# Patient Record
Sex: Female | Born: 1965 | Race: White | Hispanic: No | Smoking: Never smoker
Health system: Southern US, Community
[De-identification: ages and names within clinical notes are randomized; demographics above are authoritative.]

## PROBLEM LIST (undated history)

## (undated) DIAGNOSIS — G629 Polyneuropathy, unspecified: Secondary | ICD-10-CM

## (undated) DIAGNOSIS — M4310 Spondylolisthesis, site unspecified: Secondary | ICD-10-CM

## (undated) HISTORY — PX: BREAST SURGERY: SHX581

## (undated) HISTORY — PX: GASTRECTOMY: SHX58

## (undated) HISTORY — PX: DILATION AND CURETTAGE OF UTERUS: SHX78

## (undated) HISTORY — PX: TONSILLECTOMY: SUR1361

## (undated) HISTORY — PX: ABDOMINAL SURGERY: SHX537

---

## 2018-01-27 ENCOUNTER — Encounter (HOSPITAL_BASED_OUTPATIENT_CLINIC_OR_DEPARTMENT_OTHER): Payer: Self-pay | Admitting: *Deleted

## 2018-01-27 ENCOUNTER — Other Ambulatory Visit: Payer: Self-pay

## 2018-01-27 ENCOUNTER — Emergency Department (HOSPITAL_BASED_OUTPATIENT_CLINIC_OR_DEPARTMENT_OTHER): Payer: 59

## 2018-01-27 ENCOUNTER — Emergency Department (HOSPITAL_BASED_OUTPATIENT_CLINIC_OR_DEPARTMENT_OTHER)
Admission: EM | Admit: 2018-01-27 | Discharge: 2018-01-27 | Disposition: A | Discharge status: Home / Self Care | Payer: 59 | Attending: Emergency Medicine | Admitting: Emergency Medicine

## 2018-01-27 DIAGNOSIS — Y999 Unspecified external cause status: Secondary | ICD-10-CM | POA: Diagnosis not present

## 2018-01-27 DIAGNOSIS — Z79899 Other long term (current) drug therapy: Secondary | ICD-10-CM | POA: Diagnosis not present

## 2018-01-27 DIAGNOSIS — S40021A Contusion of right upper arm, initial encounter: Secondary | ICD-10-CM | POA: Diagnosis not present

## 2018-01-27 DIAGNOSIS — Y9301 Activity, walking, marching and hiking: Secondary | ICD-10-CM | POA: Insufficient documentation

## 2018-01-27 DIAGNOSIS — Y929 Unspecified place or not applicable: Secondary | ICD-10-CM | POA: Insufficient documentation

## 2018-01-27 DIAGNOSIS — S53401A Unspecified sprain of right elbow, initial encounter: Secondary | ICD-10-CM | POA: Diagnosis not present

## 2018-01-27 DIAGNOSIS — S59911A Unspecified injury of right forearm, initial encounter: Secondary | ICD-10-CM | POA: Diagnosis present

## 2018-01-27 DIAGNOSIS — W0110XA Fall on same level from slipping, tripping and stumbling with subsequent striking against unspecified object, initial encounter: Secondary | ICD-10-CM | POA: Diagnosis not present

## 2018-01-27 DIAGNOSIS — W19XXXA Unspecified fall, initial encounter: Secondary | ICD-10-CM

## 2018-01-27 HISTORY — DX: Polyneuropathy, unspecified: G62.9

## 2018-01-27 HISTORY — DX: Spondylolisthesis, site unspecified: M43.10

## 2018-01-27 MED ORDER — HYDROCODONE-ACETAMINOPHEN 5-325 MG PO TABS
1.0000 | ORAL_TABLET | Freq: Once | ORAL | Status: AC
  Administered 2018-01-27: 1 via ORAL
  Filled 2018-01-27: qty 1

## 2018-01-27 NOTE — ED Notes (Signed)
Pt verbalizes understanding of d/c instructions and denies any further needs at this time. 

## 2018-01-27 NOTE — Discharge Instructions (Signed)
You can take Tylenol or Ibuprofen as directed for pain. You can alternate Tylenol and Ibuprofen every 4 hours. If you take Tylenol at 1pm, then you can take Ibuprofen at 5pm. Then you can take Tylenol again at 9pm.   Wear the sling for support and stabilization of the elbow.  Do not wear it all the time as it may cause frozen shoulder.  Follow the RICE (Rest, Ice, Compression, Elevation) protocol as directed.   Follow-up with referred orthopedic doctor if her symptoms do not improve within the next 4-7 days.  Return to the emergency department for any worsening pain, redness or swelling of the arm, numbness/weakness, discoloration of the fingers or any other worsening or concerning symptoms.

## 2018-01-27 NOTE — ED Triage Notes (Signed)
Pt fell on her right side, then twisted and fell on her face. Pt complains of right sided arm pain.

## 2018-01-27 NOTE — ED Notes (Signed)
Patient transported to X-ray 

## 2018-01-27 NOTE — ED Notes (Signed)
Pt c/o pain to right elbow and right forearm after tripping and falling.  Pt has swelling and bruising to outer aspect of right forearm and is unable to extend elbow completely without pain.

## 2018-01-27 NOTE — ED Provider Notes (Signed)
[facility] EMERGENCY DEPARTMENT Provider Note   CSN: [id number] Arrival date & time: 01/27/18  1707     History   Chief Complaint Chief Complaint  Patient presents with  . Fall    HPI Maria SquibbJennifer Ramirez is a 52 y.o. female past medical history of neuropathy who presents for evaluation of mechanical fall that occurred approximate 2 PM this afternoon.  Patient reports that she was walking up a curb when she misstepped, causing her to fall. Patient reports that her right arm took the brace of the fall.  Patient reports she did not hit her head or lose consciousness.  She is not on any blood thinners.  Patient reports that she was able to get up immediately after the incident.  Patient reports that since the incident, she has had pain to the right elbow, right forearm.  Pain is worsened with movement.  She took some ibuprofen at initial onset which she states has had mild improvement.  Patient denies any numbness/weakness, neck pain, back pain, chest pain, difficulty breathing, vision changes.  The history is provided by the patient.    Past Medical History:  Diagnosis Date  . Neuropathy   . Spondylolisthesis     There are no active problems to display for this patient.   Past Surgical History:  Procedure Laterality Date  . ABDOMINAL SURGERY    . BREAST SURGERY    . DILATION AND CURETTAGE OF UTERUS    . GASTRECTOMY    . TONSILLECTOMY      OB History    No data available       Home Medications    Prior to Admission medications   Medication Sig Start Date End Date Taking? Authorizing Provider  gabapentin (NEURONTIN) 600 MG tablet Take 600 mg by mouth 3 (three) times daily.   Yes [provider]    Family History History reviewed. No pertinent family history.  Social History Social History   Tobacco Use  . Smoking status: Never Smoker  . Smokeless tobacco: Never Used  Substance Use Topics  . Alcohol use: Yes    Alcohol/week: 3.6 oz    Types:  6 Shots of liquor per week  . Drug use: No     Allergies   Bactrim [sulfamethoxazole-trimethoprim] and Red dye   Review of Systems Review of Systems  Constitutional: Negative for fever.  Respiratory: Negative for shortness of breath.   Cardiovascular: Negative for chest pain.  Gastrointestinal: Negative for abdominal pain, nausea and vomiting.  Musculoskeletal: Negative for back pain and neck pain.       Right elbow pain Right arm pain  Neurological: Negative for weakness and numbness.     Physical Exam Updated Vital Signs BP (!) 150/83 (BP Location: Left Arm)   Pulse 80   Temp 98.6 F (37 C) (Oral)   Resp 18   Ht 5' 4.5" (1.638 m)   Wt (!) 142 kg (313 lb)   LMP 12/10/2017 (Exact Date) Comment: menopausal,  single period in last yr//a.c.  SpO2 99%   BMI 52.90 kg/m   Physical Exam  Constitutional: She is oriented to person, place, and time. She appears well-developed and well-nourished.  HENT:  Head: Normocephalic and atraumatic.  Mouth/Throat: Oropharynx is clear and moist and mucous membranes are normal.  No tenderness to palpation of skull. No deformities or crepitus noted. No open wounds, abrasions or lacerations.   Eyes: Conjunctivae, EOM and lids are normal. Pupils are equal, round, and reactive to light.  Neck: Full passive range of motion without pain.  Full flexion/extension and lateral movement of neck fully intact. No bony midline tenderness. No deformities or crepitus.   Cardiovascular: Normal rate, regular rhythm, normal heart sounds and normal pulses. Exam reveals no gallop and no friction rub.  No murmur heard. Pulmonary/Chest: Effort normal and breath sounds normal.  No evidence of respiratory distress. Able to speak in full sentences without difficulty.  Abdominal: Soft. Normal appearance. There is no tenderness. There is no rigidity and no guarding.  Musculoskeletal: Normal range of motion.  Tenderness palpation the lateral aspect of the right  elbow.  No deformity or crepitus noted.  Good flexion of left elbow without difficulty.  She does report some pain with extension of the elbow though was able to complete movement.  Diffuse tenderness palpation to the forearm with overlying soft tissue swelling and hematoma.  No tenderness palpation to the right wrist.  No snuffbox tenderness.  Full range of motion of left wrist intact without any difficulty.  No abnormalities of the left upper extremity.  Neurological: She is alert and oriented to person, place, and time.  Cranial nerves III-XII intact Follows commands, Moves all extremities  5/5 strength to BUE and BLE  Sensation intact throughout all major nerve distributions Normal finger to nose. No pronator drift. No gait abnormalities  No slurred speech. No facial droop.   Skin: Skin is warm and dry. Capillary refill takes less than 2 seconds.  Good distal cap refill. RLE is not dusky in appearance or cool to touch.  Psychiatric: She has a normal mood and affect. Her speech is normal.  Nursing note and vitals reviewed.    ED Treatments / Results  Labs (all labs ordered are listed, but only abnormal results are displayed) Labs Reviewed - No data to display  EKG  EKG Interpretation None       Radiology Dg Elbow Complete Right  Result Date: 01/27/2018 CLINICAL DATA:  Fall with pain at the olecranon process EXAM: RIGHT ELBOW - COMPLETE 3+ VIEW COMPARISON:  None. FINDINGS: There is no evidence of fracture, dislocation, or joint effusion. There is no evidence of arthropathy or other focal bone abnormality. Soft tissues are unremarkable. IMPRESSION: Negative. Electronically Signed   By: Jasmine Pang M.D.   On: 01/27/2018 22:12   Dg Forearm Right  Result Date: 01/27/2018 CLINICAL DATA:  Fall with pain EXAM: RIGHT FOREARM - 2 VIEW COMPARISON:  None. FINDINGS: There is no evidence of fracture or other focal bone lesions. Soft tissues swelling at the mid forearm. IMPRESSION: No acute  osseous abnormality. Electronically Signed   By: Jasmine Pang M.D.   On: 01/27/2018 22:12    Procedures Procedures (including critical care time)  Medications Ordered in ED Medications  HYDROcodone-acetaminophen (NORCO/VICODIN) 5-325 MG per tablet 1 tablet (1 tablet Oral Given 01/27/18 2304)     Initial Impression / Assessment and Plan / ED Course  I have reviewed the triage vital signs and the nursing notes.  Pertinent labs & imaging results that were available during my care of the patient were reviewed by me and considered in my medical decision making (see chart for details).     52 y.o. female who presents for evaluation of mechanical fall that occurred this afternoon.  No head injury, LOC, vomiting.  Patient is on blood thinners.  Presents the ED with complaints of right elbow, right forearm pain. Patient is afebrile, non-toxic appearing, sitting comfortably on examination table. Vital signs reviewed and stable.  Patient is neurovascularly intact.  On exam, patient has tenderness palpation to the lateral aspect of elbow.  No deformity or crepitus noted.  Patient initially reports some pain with extension.  Flexion intact without difficulty.  Throughout exam, patient begins fully extending and flexing her elbow without any difficulty.  Forearm with a small hematoma noted to the forearm.  No deformity or crepitus noted.  Consider sprain versus fracture versus dislocation.  X-rays ordered at triage.  X-rays reviewed.  Forearm x-ray is negative for any acute fracture dislocation.  Elbow x-ray is negative for any acute fracture dislocation.  No evidence of effusion.  Given findings on x-ray and good range of motion, do not suspect an occult fracture. Discussed results with patient.  Symptoms likely result of strain versus contusion.  Supportive measures discussed with patient.  Instructed patient follow-up with her primary care doctor.  We will plan to give outpatient orthopedic referral if  patient does not experience any improvement in symptoms. Patient had ample opportunity for questions and discussion. All patient's questions were answered with full understanding. Strict return precautions discussed. Patient expresses understanding and agreement to plan.    Final Clinical Impressions(s) / ED Diagnoses   Final diagnoses:  Fall, initial encounter  Sprain of right elbow, initial encounter  Contusion of right upper extremity, initial encounter    ED Discharge Orders    None       Rosana Hoes 01/28/18 1602    Tegeler, Canary Brim, MD 01/28/18 1714

## 2019-01-15 ENCOUNTER — Emergency Department (HOSPITAL_COMMUNITY): Payer: 59

## 2019-01-15 ENCOUNTER — Emergency Department (HOSPITAL_COMMUNITY)
Admission: EM | Admit: 2019-01-15 | Discharge: 2019-01-15 | Disposition: A | Discharge status: Home / Self Care | Payer: 59 | Attending: Emergency Medicine | Admitting: Emergency Medicine

## 2019-01-15 ENCOUNTER — Encounter: Payer: Self-pay | Admitting: Emergency Medicine

## 2019-01-15 DIAGNOSIS — S4992XA Unspecified injury of left shoulder and upper arm, initial encounter: Secondary | ICD-10-CM

## 2019-01-15 DIAGNOSIS — S4982XA Other specified injuries of left shoulder and upper arm, initial encounter: Secondary | ICD-10-CM | POA: Insufficient documentation

## 2019-01-15 DIAGNOSIS — Y999 Unspecified external cause status: Secondary | ICD-10-CM | POA: Insufficient documentation

## 2019-01-15 DIAGNOSIS — Y939 Activity, unspecified: Secondary | ICD-10-CM | POA: Insufficient documentation

## 2019-01-15 DIAGNOSIS — Y929 Unspecified place or not applicable: Secondary | ICD-10-CM | POA: Insufficient documentation

## 2019-01-15 DIAGNOSIS — X58XXXA Exposure to other specified factors, initial encounter: Secondary | ICD-10-CM | POA: Diagnosis not present

## 2019-01-15 MED ORDER — HYDROCODONE-ACETAMINOPHEN 5-325 MG PO TABS: 1.0000 | ORAL_TABLET | Freq: Four times a day (QID) | ORAL | 0 refills | Status: AC | PRN

## 2019-01-15 NOTE — ED Provider Notes (Signed)
[facility]-EMERGENCY DEPT Provider Note: Maria DellJ. Lane Garik Diamant, MD, FACEP  CSN: [id number] MRN: [id number] ARRIVAL: 01/15/19 at 0507 ROOM: WA19/WA19   CHIEF COMPLAINT  Shoulder Injury   HISTORY OF PRESENT ILLNESS  01/15/19 6:05 AM Maria Ramirez is a 53 y.o. female who was sitting for an extended time with her left elbow resting on a armrest.  When she got up she felt pain in her left shoulder leading her to believe she has dislocated it.  At rest her shoulder pain is a 5 out of 10 but describes it as a 12 out of 10 when she attempts to move her left arm.  She is having no numbness or weakness distal to the shoulder.   Past Medical History:  Diagnosis Date  . Neuropathy   . Spondylolisthesis     Past Surgical History:  Procedure Laterality Date  . ABDOMINAL SURGERY    . BREAST SURGERY    . DILATION AND CURETTAGE OF UTERUS    . GASTRECTOMY    . TONSILLECTOMY      History reviewed. No pertinent family history.  Social History   Tobacco Use  . Smoking status: Never Smoker  . Smokeless tobacco: Never Used  Substance Use Topics  . Alcohol use: Yes    Alcohol/week: 6.0 standard drinks    Types: 6 Shots of liquor per week  . Drug use: No    Prior to Admission medications   Medication Sig Start Date End Date Taking? Authorizing Provider  gabapentin (NEURONTIN) 600 MG tablet Take 600 mg by mouth 3 (three) times daily.    [provider]    Allergies Bactrim [sulfamethoxazole-trimethoprim] and Red dye   REVIEW OF SYSTEMS  Negative except as noted here or in the History of Present Illness.   PHYSICAL EXAMINATION  Initial Vital Signs There were no vitals taken for this visit.  Examination General: Well-developed, well-nourished female in no acute distress; appearance consistent with age of record HENT: normocephalic; atraumatic Eyes: Normal appearance Neck: supple Heart: regular rate and rhythm Lungs: clear to auscultation bilaterally Abdomen: soft; obese;  nontender Extremities: Left arm held in abduction and internal rotation; tenderness of left shoulder without gross deformity; left upper extremity distally neurovascularly intact Neurologic: Awake, alert and oriented; motor function intact in all extremities and symmetric; no facial droop Skin: Warm and dry Psychiatric: Normal mood and affect   RESULTS  Summary of this visit's results, reviewed by myself:   EKG Interpretation  Date/Time:    Ventricular Rate:    PR Interval:    QRS Duration:   QT Interval:    QTC Calculation:   R Axis:     Text Interpretation:        Laboratory Studies: No results found for this or any previous visit (from the past 24 hour(s)). Imaging Studies: Dg Shoulder Left  Result Date: 01/15/2019 CLINICAL DATA:  Initial evaluation for acute dislocation. EXAM: LEFT SHOULDER - 2+ VIEW COMPARISON:  None. FINDINGS: Humeral head in normal alignment within the glenoid. AC joint approximated. No acute fracture dislocation. 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 No periarticular calcification. No acute soft tissue abnormality. Visualized left hemithorax clear. IMPRESSION: No acute fracture or dislocation about the left shoulder. Electronically Signed   By: Rise MuBenjamin  McClintock M.D.   On: 01/15/2019 05:56    ED COURSE and MDM  Nursing notes and initial vitals signs, including pulse oximetry, reviewed.  Vitals:   01/15/19 0517  BP: (!) 143/74  Pulse: 90  Resp: 18  Temp: 97.9 F (36.6 C)  TempSrc: Oral  SpO2: 100%   Patient thought her shoulder was dislocated but radiographs do not show this.  She may have dislocated it and had it spontaneously reduced.  We will place her in a sling and [facility]  PROCEDURES    ED DIAGNOSES     ICD-10-CM   1. Injury of left shoulder, initial encounter S49.92XA        [name], Jonny RuizJohn, MD 01/15/19 [phone]

## 2019-01-15 NOTE — ED Triage Notes (Signed)
Pt states that she was leaning on her left arm on a chair for too long and was unable to move it when she got up. Pt complains of 10/10 pain when trying to move her arm

## 2019-01-15 NOTE — ED Notes (Signed)
Patient ambulated to x-ray.

## 2019-02-22 IMAGING — CR DG SHOULDER 2+V*L*
2 series · 2 of 2 positions shown · non-contrast
Comparison: None.

CLINICAL DATA: Initial evaluation for acute dislocation.

EXAM:
LEFT SHOULDER - 2+ VIEW

[w shoulder external left]
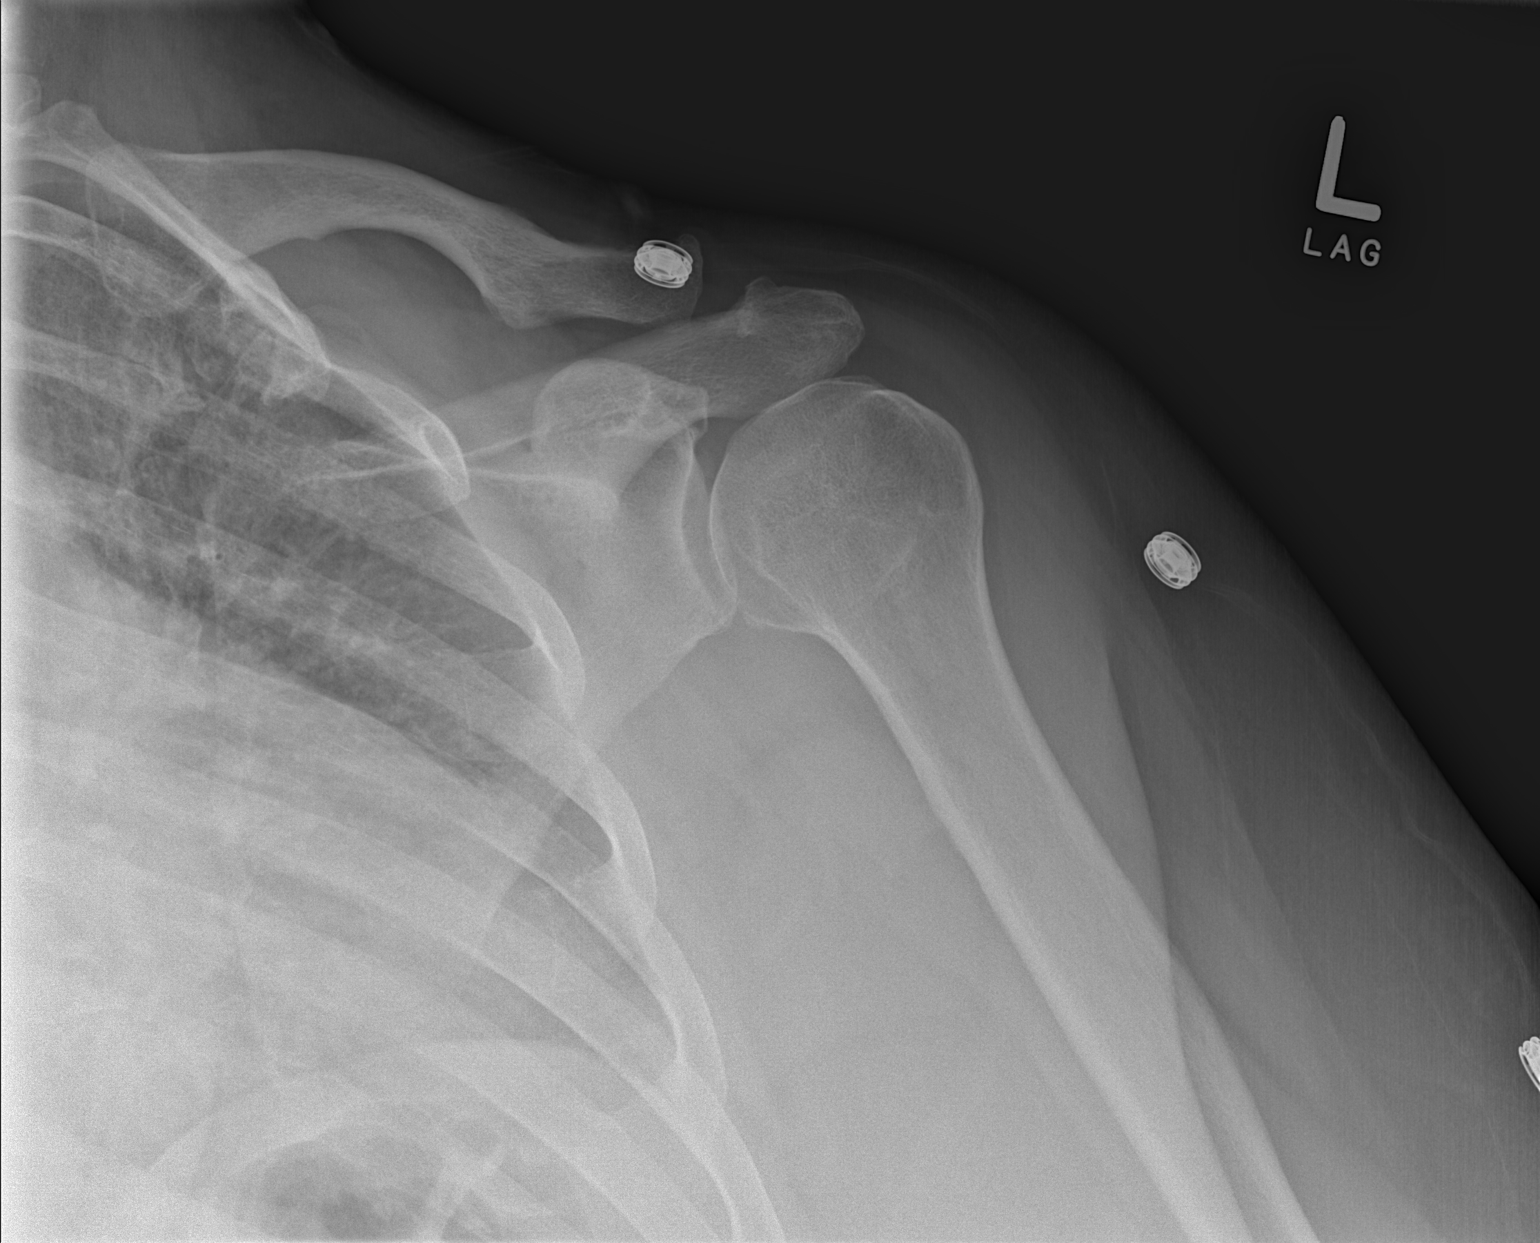

[w shoulder y-view left]
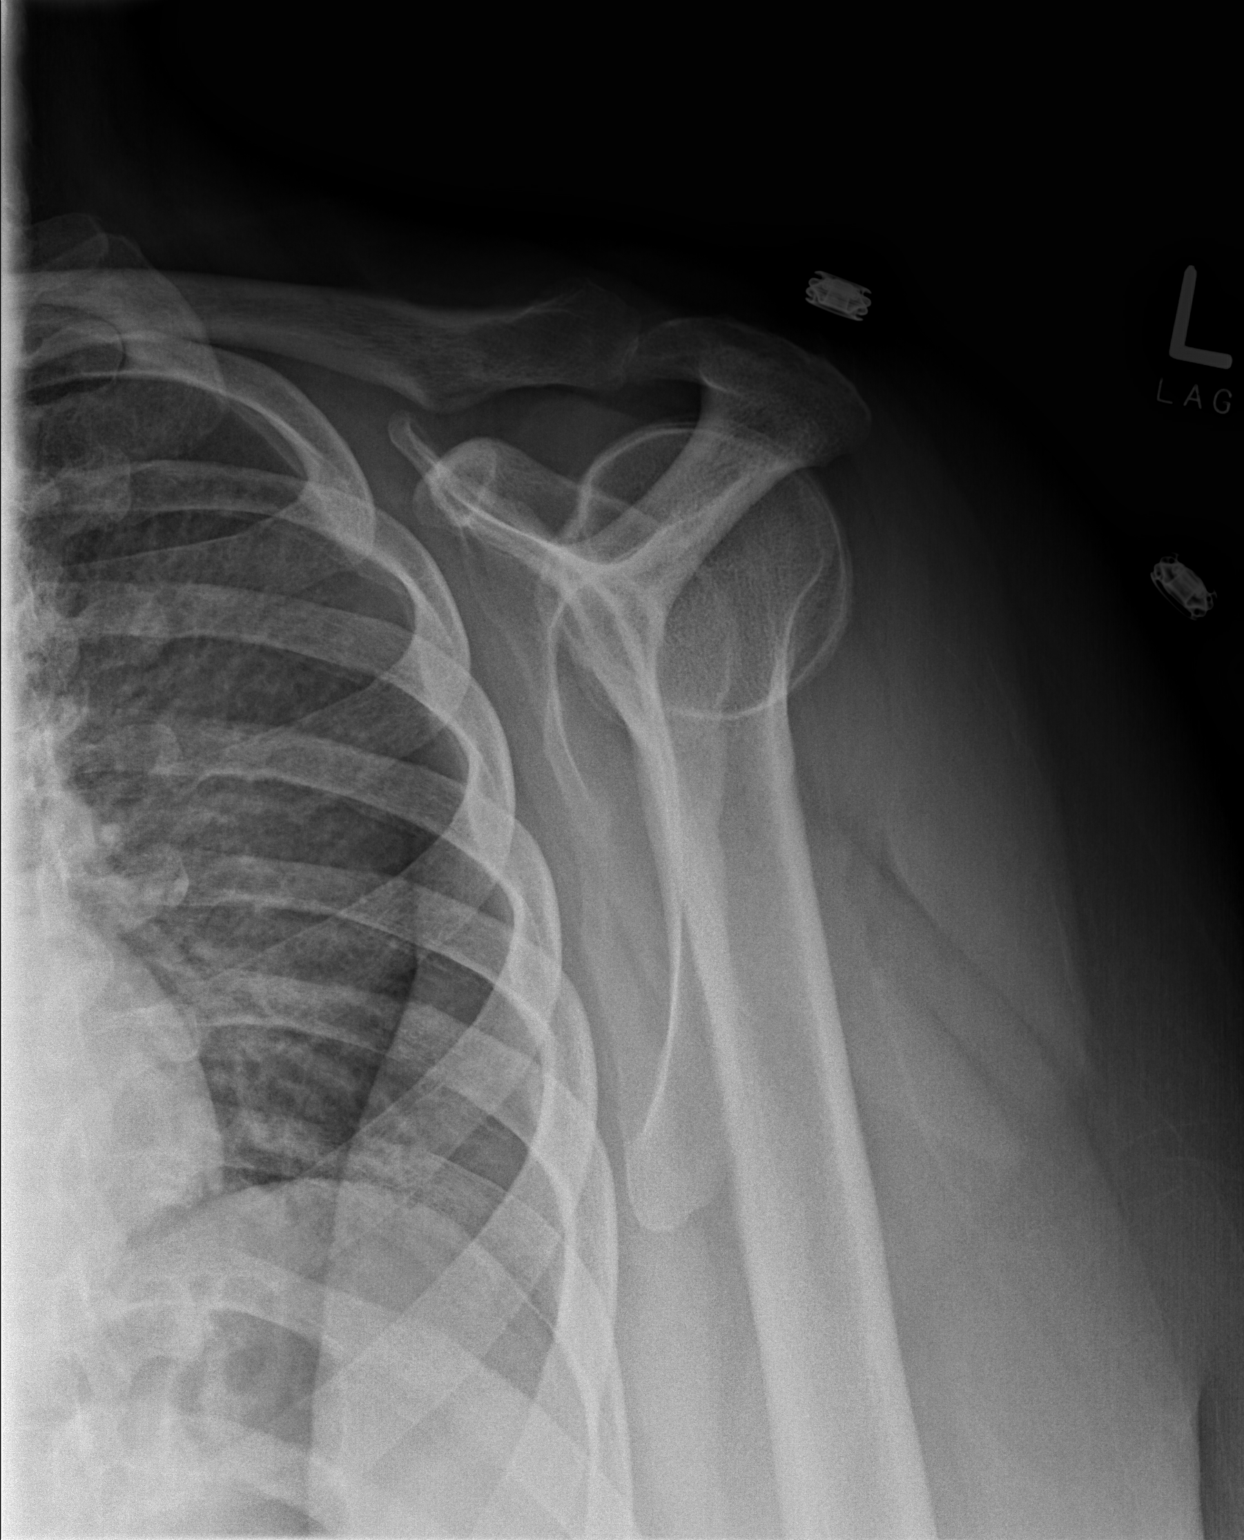

[2 of 2 positions shown; findings below may reference images not displayed]

FINDINGS: Humeral head in normal alignment within the glenoid. AC joint
approximated. No acute fracture dislocation. Mild osteoarthritic
changes noted at the glenohumeral and acromioclavicular
articulations. No periarticular calcification. No acute soft tissue
abnormality. Visualized left hemithorax clear.
IMPRESSION: No acute fracture or dislocation about the left shoulder.

## 2020-01-29 ENCOUNTER — Ambulatory Visit: Payer: 59 | Attending: Internal Medicine

## 2020-01-29 DIAGNOSIS — Z23 Encounter for immunization: Secondary | ICD-10-CM | POA: Insufficient documentation

## 2020-01-29 NOTE — Progress Notes (Signed)
   Covid-19 Vaccination Clinic  Name:  Maria Ramirez    MRN: [id number] DOB: 05/12/1966  01/29/2020  [name] was observed post Covid-19 immunization for 15 minutes without incidence. She was provided with Vaccine Information Sheet and instruction to access the V-Safe system.   [name] was instructed to call 911 with any severe reactions post vaccine: Marland Kitchen Difficulty breathing  . Swelling of your face and throat  . A fast heartbeat  . A bad rash all over your body  . Dizziness and weakness    Immunizations Administered    Name Date Dose VIS Date Route   Pfizer COVID-19 Vaccine 01/29/2020  9:31 AM 0.3 mL 11/20/2019 Intramuscular   Manufacturer: [employer], [employer]   Lot: [id number]   NDC: [address]-3

## 2020-02-23 ENCOUNTER — Ambulatory Visit: Payer: 59 | Attending: Internal Medicine

## 2020-02-23 DIAGNOSIS — Z23 Encounter for immunization: Secondary | ICD-10-CM

## 2020-02-23 NOTE — Progress Notes (Signed)
   Covid-19 Vaccination Clinic  Name:  Maria Ramirez    MRN: [id number] DOB: 05/08/1966  02/23/2020  Maria Ramirez was observed post Covid-19 immunization for 15 minutes without incident. She was provided with Vaccine Information Sheet and instruction to access the V-Safe system.   Maria Ramirez was instructed to call 911 with any severe reactions post vaccine: Marland Kitchen Difficulty breathing  . Swelling of face and throat  . A fast heartbeat  . A bad rash all over body  . Dizziness and weakness   Immunizations Administered    Name Date Dose VIS Date Route   Pfizer COVID-19 Vaccine 02/23/2020  8:51 AM 0.3 mL 11/20/2019 Intramuscular   Manufacturer: [employer], [employer]   Lot: [id number]   NDC: [address]-1

## 2020-11-27 ENCOUNTER — Other Ambulatory Visit: Payer: Self-pay | Admitting: Family Medicine
